# Patient Record
Sex: Female | Born: 1960 | Race: Black or African American | Hispanic: No | Marital: Married | Smoking: Former smoker
Health system: Southern US, Community
[De-identification: ages and names within clinical notes are randomized; demographics above are authoritative.]

## PROBLEM LIST (undated history)

## (undated) DIAGNOSIS — K219 Gastro-esophageal reflux disease without esophagitis: Secondary | ICD-10-CM

---

## 2005-09-06 ENCOUNTER — Encounter: Admission: RE | Admit: 2005-09-06 | Discharge: 2005-09-06 | Payer: Self-pay | Admitting: Obstetrics and Gynecology

## 2006-05-04 ENCOUNTER — Emergency Department (HOSPITAL_COMMUNITY): Admission: EM | Admit: 2006-05-04 | Discharge: 2006-05-04 | Payer: Self-pay | Admitting: Emergency Medicine

## 2006-08-02 ENCOUNTER — Emergency Department (HOSPITAL_COMMUNITY): Admission: EM | Admit: 2006-08-02 | Discharge: 2006-08-02 | Payer: Self-pay | Admitting: Emergency Medicine

## 2006-09-24 ENCOUNTER — Encounter: Admission: RE | Admit: 2006-09-24 | Discharge: 2006-09-24 | Payer: Self-pay | Admitting: Obstetrics and Gynecology

## 2007-05-01 ENCOUNTER — Emergency Department (HOSPITAL_COMMUNITY): Admission: EM | Admit: 2007-05-01 | Discharge: 2007-05-01 | Payer: Self-pay | Admitting: Emergency Medicine

## 2008-11-16 ENCOUNTER — Encounter: Admission: RE | Admit: 2008-11-16 | Discharge: 2008-11-16 | Payer: Self-pay | Admitting: Obstetrics and Gynecology

## 2009-12-19 ENCOUNTER — Encounter: Admission: RE | Admit: 2009-12-19 | Discharge: 2009-12-19 | Payer: Self-pay | Admitting: Obstetrics and Gynecology

## 2010-11-04 ENCOUNTER — Encounter: Payer: Self-pay | Admitting: Obstetrics and Gynecology

## 2010-11-05 ENCOUNTER — Encounter: Payer: Self-pay | Admitting: Obstetrics and Gynecology

## 2010-12-18 ENCOUNTER — Other Ambulatory Visit: Payer: Self-pay | Admitting: Obstetrics and Gynecology

## 2010-12-18 DIAGNOSIS — Z1231 Encounter for screening mammogram for malignant neoplasm of breast: Secondary | ICD-10-CM

## 2011-01-04 ENCOUNTER — Ambulatory Visit
Admission: RE | Admit: 2011-01-04 | Discharge: 2011-01-04 | Disposition: A | Discharge status: Home / Self Care | Payer: BC Managed Care – PPO | Source: Ambulatory Visit | Attending: Obstetrics and Gynecology | Admitting: Obstetrics and Gynecology

## 2011-01-04 DIAGNOSIS — Z1231 Encounter for screening mammogram for malignant neoplasm of breast: Secondary | ICD-10-CM

## 2012-03-30 ENCOUNTER — Other Ambulatory Visit: Payer: Self-pay | Admitting: Obstetrics and Gynecology

## 2012-03-30 DIAGNOSIS — Z1231 Encounter for screening mammogram for malignant neoplasm of breast: Secondary | ICD-10-CM

## 2012-04-06 ENCOUNTER — Ambulatory Visit
Admission: RE | Admit: 2012-04-06 | Discharge: 2012-04-06 | Disposition: A | Discharge status: Home / Self Care | Payer: BC Managed Care – PPO | Source: Ambulatory Visit | Attending: Obstetrics and Gynecology | Admitting: Obstetrics and Gynecology

## 2012-04-06 DIAGNOSIS — Z1231 Encounter for screening mammogram for malignant neoplasm of breast: Secondary | ICD-10-CM

## 2013-02-17 ENCOUNTER — Other Ambulatory Visit: Payer: Self-pay

## 2013-02-17 DIAGNOSIS — Z1231 Encounter for screening mammogram for malignant neoplasm of breast: Secondary | ICD-10-CM

## 2013-04-09 ENCOUNTER — Ambulatory Visit: Payer: BC Managed Care – PPO

## 2013-04-20 ENCOUNTER — Other Ambulatory Visit (HOSPITAL_COMMUNITY): Payer: Self-pay | Admitting: Cardiology

## 2013-04-20 DIAGNOSIS — M25562 Pain in left knee: Secondary | ICD-10-CM

## 2013-04-20 DIAGNOSIS — M7989 Other specified soft tissue disorders: Secondary | ICD-10-CM

## 2013-04-26 ENCOUNTER — Ambulatory Visit (HOSPITAL_COMMUNITY): Payer: BC Managed Care – PPO

## 2013-05-04 ENCOUNTER — Ambulatory Visit
Admission: RE | Admit: 2013-05-04 | Discharge: 2013-05-04 | Disposition: A | Discharge status: Home / Self Care | Payer: BC Managed Care – PPO | Source: Ambulatory Visit

## 2013-05-04 DIAGNOSIS — Z1231 Encounter for screening mammogram for malignant neoplasm of breast: Secondary | ICD-10-CM

## 2013-06-05 ENCOUNTER — Emergency Department (HOSPITAL_COMMUNITY)
Admission: EM | Admit: 2013-06-05 | Discharge: 2013-06-05 | Disposition: A | Discharge status: Home / Self Care | Payer: BC Managed Care – PPO | Source: Home / Self Care

## 2013-06-05 ENCOUNTER — Encounter (HOSPITAL_COMMUNITY): Payer: Self-pay | Admitting: Emergency Medicine

## 2013-06-05 DIAGNOSIS — L0202 Furuncle of face: Secondary | ICD-10-CM

## 2013-06-05 DIAGNOSIS — H6000 Abscess of external ear, unspecified ear: Secondary | ICD-10-CM

## 2013-06-05 MED ORDER — CEPHALEXIN 500 MG PO CAPS: 500.0000 mg | ORAL_CAPSULE | Freq: Two times a day (BID) | ORAL | Status: AC

## 2013-06-05 NOTE — ED Notes (Signed)
Right ear pain, visible redness at opening of ear canal, visible swelling

## 2013-06-05 NOTE — ED Provider Notes (Signed)
Medical screening examination/treatment/procedure(s) were performed by a resident physician or non-physician practitioner and as the supervising physician I was immediately available for consultation/collaboration.  Evan Corey, MD   Evan S Corey, MD 06/05/13 1956 

## 2013-06-05 NOTE — ED Provider Notes (Signed)
  CSN: [id number]     Arrival date & time 06/05/13  1212 History     None    No chief complaint on file.  (Consider location/radiation/quality/duration/timing/severity/associated sxs/prior Treatment) HPI Comments: Patient presents with right ear swelling. Noticed an area of swelling and tenderness on the inside of her right ear canal. No drainage. No lymph node swelling. No URI symptoms. No fever or chills. No loss of hearing.    The history is provided by the patient.    No past medical history on file. No past surgical history on file. No family history on file. History  Substance Use Topics  . Smoking status: Not on file  . Smokeless tobacco: Not on file  . Alcohol Use: Not on file   OB History   Grav Para Term Preterm Abortions TAB SAB Ect Mult Living                 Review of Systems  All other systems reviewed and are negative.    Allergies  Review of patient's allergies indicates not on file.  Home Medications   Current Outpatient Rx  Name  Route  Sig  Dispense  Refill  . cephALEXin (KEFLEX) 500 MG capsule   Oral   Take 1 capsule (500 mg total) by mouth 2 (two) times daily.   14 capsule   0    BP 121/86  Pulse 87  Temp(Src) 98.7 F (37.1 C) (Oral)  Resp 16  SpO2 99% Physical Exam  Nursing note and vitals reviewed. Constitutional: She appears well-developed and well-nourished. No distress.  HENT:  Head: Normocephalic and atraumatic.  Right Ear: Hearing and tympanic membrane normal. There is swelling. Tympanic membrane is not injected, not scarred and not perforated.  Mouth/Throat: No oropharyngeal exudate.  Small boil on the very inner ear of right canal. Non-fluctuant. No drainage. Pain to palpation.   Skin: Skin is warm and dry.  Psychiatric: Her behavior is normal.    ED Course   Procedures (including critical care time)  Labs Reviewed - No data to display No results found. 1. Boil, ear     MDM  Right ear boil-Warm compresses and abx.  Because of location refer to ENT if worsens  Azucena Fallen, PA-C 06/05/13 1251

## 2014-12-20 ENCOUNTER — Other Ambulatory Visit: Payer: Self-pay

## 2014-12-20 DIAGNOSIS — Z1231 Encounter for screening mammogram for malignant neoplasm of breast: Secondary | ICD-10-CM

## 2015-01-02 ENCOUNTER — Ambulatory Visit: Payer: Self-pay

## 2015-08-29 ENCOUNTER — Ambulatory Visit: Payer: Self-pay

## 2015-09-04 ENCOUNTER — Ambulatory Visit: Payer: Self-pay

## 2015-09-06 ENCOUNTER — Ambulatory Visit
Admission: RE | Admit: 2015-09-06 | Discharge: 2015-09-06 | Disposition: A | Discharge status: Home / Self Care | Payer: BLUE CROSS/BLUE SHIELD | Source: Ambulatory Visit

## 2015-09-06 DIAGNOSIS — Z1231 Encounter for screening mammogram for malignant neoplasm of breast: Secondary | ICD-10-CM

## 2016-10-23 ENCOUNTER — Other Ambulatory Visit: Payer: Self-pay | Admitting: Family Medicine

## 2016-10-23 DIAGNOSIS — Z1231 Encounter for screening mammogram for malignant neoplasm of breast: Secondary | ICD-10-CM

## 2016-11-28 ENCOUNTER — Ambulatory Visit: Payer: BLUE CROSS/BLUE SHIELD

## 2016-12-11 ENCOUNTER — Ambulatory Visit
Admission: RE | Admit: 2016-12-11 | Discharge: 2016-12-11 | Disposition: A | Discharge status: Home / Self Care | Payer: BLUE CROSS/BLUE SHIELD | Source: Ambulatory Visit | Attending: Family Medicine | Admitting: Family Medicine

## 2016-12-11 DIAGNOSIS — Z1231 Encounter for screening mammogram for malignant neoplasm of breast: Secondary | ICD-10-CM

## 2018-01-13 ENCOUNTER — Other Ambulatory Visit: Payer: Self-pay

## 2018-01-13 ENCOUNTER — Other Ambulatory Visit: Payer: Self-pay | Admitting: Obstetrics and Gynecology

## 2018-01-13 ENCOUNTER — Encounter (HOSPITAL_COMMUNITY): Payer: Self-pay | Admitting: *Deleted

## 2018-01-26 ENCOUNTER — Encounter (HOSPITAL_COMMUNITY)
Admission: RE | Disposition: A | Discharge status: Home / Self Care | Payer: Self-pay | Source: Ambulatory Visit | Attending: Obstetrics and Gynecology

## 2018-01-26 ENCOUNTER — Encounter (HOSPITAL_COMMUNITY): Payer: Self-pay

## 2018-01-26 ENCOUNTER — Ambulatory Visit (HOSPITAL_COMMUNITY)
Admission: RE | Admit: 2018-01-26 | Discharge: 2018-01-26 | Disposition: A | Discharge status: Home / Self Care | Payer: BLUE CROSS/BLUE SHIELD | Source: Ambulatory Visit | Attending: Obstetrics and Gynecology | Admitting: Obstetrics and Gynecology

## 2018-01-26 ENCOUNTER — Ambulatory Visit (HOSPITAL_COMMUNITY): Payer: BLUE CROSS/BLUE SHIELD | Admitting: Anesthesiology

## 2018-01-26 ENCOUNTER — Other Ambulatory Visit: Payer: Self-pay

## 2018-01-26 DIAGNOSIS — N858 Other specified noninflammatory disorders of uterus: Secondary | ICD-10-CM | POA: Insufficient documentation

## 2018-01-26 DIAGNOSIS — Z7951 Long term (current) use of inhaled steroids: Secondary | ICD-10-CM | POA: Diagnosis not present

## 2018-01-26 DIAGNOSIS — D250 Submucous leiomyoma of uterus: Secondary | ICD-10-CM | POA: Insufficient documentation

## 2018-01-26 DIAGNOSIS — Z87891 Personal history of nicotine dependence: Secondary | ICD-10-CM | POA: Diagnosis not present

## 2018-01-26 DIAGNOSIS — Z7982 Long term (current) use of aspirin: Secondary | ICD-10-CM | POA: Diagnosis not present

## 2018-01-26 HISTORY — PX: DILATATION & CURETTAGE/HYSTEROSCOPY WITH MYOSURE: SHX6511

## 2018-01-26 HISTORY — DX: Gastro-esophageal reflux disease without esophagitis: K21.9

## 2018-01-26 LAB — CBC
HEMATOCRIT: 37.3 % (ref 36.0–46.0)
Hemoglobin: 12.5 g/dL (ref 12.0–15.0)
MCH: 30.3 pg (ref 26.0–34.0)
MCHC: 33.5 g/dL (ref 30.0–36.0)
MCV: 90.3 fL (ref 78.0–100.0)
Platelets: 216 10*3/uL (ref 150–400)
RBC: 4.13 MIL/uL (ref 3.87–5.11)
RDW: 12.6 % (ref 11.5–15.5)
WBC: 4.7 10*3/uL (ref 4.0–10.5)

## 2018-01-26 SURGERY — DILATATION & CURETTAGE/HYSTEROSCOPY WITH MYOSURE
Anesthesia: General | Site: Vagina

## 2018-01-26 MED ORDER — MIDAZOLAM HCL 2 MG/2ML IJ SOLN
INTRAMUSCULAR | Status: DC | PRN
  Administered 2018-01-26: 2 mg via INTRAVENOUS

## 2018-01-26 MED ORDER — SODIUM CHLORIDE 0.9 % IR SOLN
Status: DC | PRN
  Administered 2018-01-26: 3000 mL

## 2018-01-26 MED ORDER — SCOPOLAMINE 1 MG/3DAYS TD PT72
MEDICATED_PATCH | TRANSDERMAL | Status: AC
  Administered 2018-01-26: 1.5 mg via TRANSDERMAL
  Filled 2018-01-26: qty 1

## 2018-01-26 MED ORDER — OXYCODONE-ACETAMINOPHEN 5-325 MG PO TABS: 1.0000 | ORAL_TABLET | ORAL | 0 refills | Status: AC | PRN

## 2018-01-26 MED ORDER — LACTATED RINGERS IV SOLN
INTRAVENOUS | Status: DC
  Administered 2018-01-26: 125 mL/h via INTRAVENOUS

## 2018-01-26 MED ORDER — ONDANSETRON HCL 4 MG/2ML IJ SOLN
INTRAMUSCULAR | Status: DC | PRN
  Administered 2018-01-26: 4 mg via INTRAVENOUS

## 2018-01-26 MED ORDER — DEXAMETHASONE SODIUM PHOSPHATE 10 MG/ML IJ SOLN
INTRAMUSCULAR | Status: DC | PRN
  Administered 2018-01-26: 4 mg via INTRAVENOUS

## 2018-01-26 MED ORDER — FENTANYL CITRATE (PF) 100 MCG/2ML IJ SOLN
INTRAMUSCULAR | Status: DC | PRN
  Administered 2018-01-26: 100 ug via INTRAVENOUS

## 2018-01-26 MED ORDER — PROPOFOL 10 MG/ML IV BOLUS
INTRAVENOUS | Status: DC | PRN
  Administered 2018-01-26: 200 mg via INTRAVENOUS

## 2018-01-26 MED ORDER — FENTANYL CITRATE (PF) 100 MCG/2ML IJ SOLN
INTRAMUSCULAR | Status: AC
  Filled 2018-01-26: qty 2

## 2018-01-26 MED ORDER — SCOPOLAMINE 1 MG/3DAYS TD PT72
1.0000 | MEDICATED_PATCH | Freq: Once | TRANSDERMAL | Status: DC
  Administered 2018-01-26: 1.5 mg via TRANSDERMAL

## 2018-01-26 MED ORDER — MIDAZOLAM HCL 2 MG/2ML IJ SOLN
INTRAMUSCULAR | Status: AC
  Filled 2018-01-26: qty 2

## 2018-01-26 SURGICAL SUPPLY — 17 items
CANISTER SUCT 3000ML PPV (MISCELLANEOUS) ×3 IMPLANT
CATH ROBINSON RED A/P 16FR (CATHETERS) ×3 IMPLANT
DEVICE MYOSURE LITE (MISCELLANEOUS) IMPLANT
DEVICE MYOSURE REACH (MISCELLANEOUS) IMPLANT
FILTER ARTHROSCOPY CONVERTOR (FILTER) ×3 IMPLANT
GLOVE BIOGEL PI IND STRL 7.0 (GLOVE) ×2 IMPLANT
GLOVE BIOGEL PI INDICATOR 7.0 (GLOVE) ×4
GLOVE ECLIPSE 6.5 STRL STRAW (GLOVE) ×3 IMPLANT
GOWN STRL REUS W/TWL LRG LVL3 (GOWN DISPOSABLE) ×6 IMPLANT
MYOSURE XL FIBROID REM (MISCELLANEOUS) ×3
PACK VAGINAL MINOR WOMEN LF (CUSTOM PROCEDURE TRAY) ×3 IMPLANT
PAD OB MATERNITY 4.3X12.25 (PERSONAL CARE ITEMS) ×3 IMPLANT
SEAL ROD LENS SCOPE MYOSURE (ABLATOR) ×3 IMPLANT
SYSTEM TISS REMOVAL MYSR XL RM (MISCELLANEOUS) IMPLANT
TOWEL OR 17X24 6PK STRL BLUE (TOWEL DISPOSABLE) ×6 IMPLANT
TUBING AQUILEX INFLOW (TUBING) ×3 IMPLANT
TUBING AQUILEX OUTFLOW (TUBING) ×3 IMPLANT

## 2018-01-26 NOTE — Discharge Instructions (Signed)

## 2018-01-26 NOTE — Anesthesia Postprocedure Evaluation (Signed)
Anesthesia Post Note  Patient: Carmen Velez  Procedure(s) Performed: DILATATION & CURETTAGE/HYSTEROSCOPY WITH MYOSURE Resection Submucosal Fibroid (N/A Vagina )     Patient location during evaluation: PACU Anesthesia Type: General Level of consciousness: sedated Pain management: pain level controlled Vital Signs Assessment: post-procedure vital signs reviewed and stable Respiratory status: spontaneous breathing and respiratory function stable Cardiovascular status: stable Postop Assessment: no apparent nausea or vomiting Anesthetic complications: no    Last Vitals:  Vitals:   01/26/18 1503 01/26/18 1530  BP:  130/72  Pulse: 62 62  Resp: 17 18  Temp: 36.9 C   SpO2: 100% 100%    Last Pain:  Vitals:   01/26/18 1530  TempSrc:   PainSc: 0-No pain   Pain Goal: Patients Stated Pain Goal: 3 (01/26/18 1234)               Jojo Geving DANIEL

## 2018-01-26 NOTE — Transfer of Care (Signed)
Immediate Anesthesia Transfer of Care Note  Patient: Carmen Velez  Procedure(s) Performed: DILATATION & CURETTAGE/HYSTEROSCOPY WITH MYOSURE Resection Submucosal Fibroid (N/A Vagina )  Patient Location: PACU  Anesthesia Type:General  Level of Consciousness: awake, alert  and oriented  Airway & Oxygen Therapy: Patient Spontanous Breathing and Patient connected to nasal cannula oxygen  Post-op Assessment: Report given to RN and Post -op Vital signs reviewed and stable  Post vital signs: Reviewed and stable  Last Vitals:  Vitals Value Taken Time  BP 123/88 01/26/2018  2:08 PM  Temp    Pulse 81 01/26/2018  2:10 PM  Resp 22 01/26/2018  2:10 PM  SpO2 87 % 01/26/2018  2:10 PM  Vitals shown include unvalidated device data.  Last Pain:  Vitals:   01/26/18 1234  TempSrc: Oral      Patients Stated Pain Goal: 3 (88/41/66 0630)  Complications: No apparent anesthesia complications

## 2018-01-26 NOTE — Brief Op Note (Signed)
01/26/2018  2:07 PM  PATIENT:  Carmen Velez  57 y.o. female  PRE-OPERATIVE DIAGNOSIS:  Submucosal Fibroid, Endometrial Thickening on Sonogram  POST-OPERATIVE DIAGNOSIS:  Submucosal Fibroid, Endometrial Thickening on Sonogram  PROCEDURE:  Diagnostic hysteroscopy, hysteroscopic resection of SM fibroids, D&C  SURGEON:  Surgeon(s) and Role:    * Sharnay Cashion, MD - Primary  PHYSICIAN ASSISTANT:   ASSISTANTS: none   ANESTHESIA:   general Finding: ant SM fibroid( pea size, large right lateral wall fibroid, left tubal ostia sclerosed, right tubal ostia seen EBL:  2 mL   BLOOD ADMINISTERED:none  DRAINS: none   LOCAL MEDICATIONS USED:  NONE  SPECIMEN:  Source of Specimen:  sm fibroid resections, endometrial curetting  DISPOSITION OF SPECIMEN:  PATHOLOGY  COUNTS:  YES  TOURNIQUET:  * No tourniquets in log *  DICTATION: .Other Dictation: Dictation Number [id number]  PLAN OF CARE: Discharge to home after PACU  PATIENT DISPOSITION:  PACU - hemodynamically stable.   Delay start of Pharmacological VTE agent (>24hrs) due to surgical blood loss or risk of bleeding: no

## 2018-01-26 NOTE — Anesthesia Preprocedure Evaluation (Addendum)
Anesthesia Evaluation  Patient identified by MRN, date of birth, ID band Patient awake    Reviewed: Allergy & Precautions, NPO status , Patient's Chart, lab work & pertinent test results  History of Anesthesia Complications Negative for: history of anesthetic complications  Airway Mallampati: II  TM Distance: >3 FB Neck ROM: Full    Dental no notable dental hx. (+) Dental Advisory Given   Pulmonary former smoker,    Pulmonary exam normal        Cardiovascular negative cardio ROS Normal cardiovascular exam     Neuro/Psych negative neurological ROS     GI/Hepatic Neg liver ROS, GERD  ,  Endo/Other  negative endocrine ROS  Renal/GU negative Renal ROS     Musculoskeletal negative musculoskeletal ROS (+)   Abdominal   Peds  Hematology negative hematology ROS (+)   Anesthesia Other Findings Day of surgery medications reviewed with the patient.  Reproductive/Obstetrics                            Anesthesia Physical Anesthesia Plan  ASA: II  Anesthesia Plan: General   Post-op Pain Management:    Induction: Intravenous  PONV Risk Score and Plan: 4 or greater and Ondansetron, Dexamethasone, Scopolamine patch - Pre-op and Diphenhydramine  Airway Management Planned: LMA  Additional Equipment:   Intra-op Plan:   Post-operative Plan: Extubation in OR  Informed Consent: I have reviewed the patients History and Physical, chart, labs and discussed the procedure including the risks, benefits and alternatives for the proposed anesthesia with the patient or authorized representative who has indicated his/her understanding and acceptance.   Dental advisory given  Plan Discussed with: Anesthesiologist  Anesthesia Plan Comments:        Anesthesia Quick Evaluation

## 2018-01-26 NOTE — H&P (Signed)
Carmen Velez is an 57 y.o. female BF hx TL presents for dx hysteroscopy, D&C, resection of endometrial mass  Pertinent Gynecological History: Menses: post-menopausal Bleeding: n/a Contraception: none DES exposure: denies Blood transfusions: none Sexually transmitted diseases: no past history Previous GYN Procedures: TL  Last mammogram: normal Date: 2018 Last pap: normal Date: 2018 OB History:   Menstrual History: Menarche age: n/a No LMP recorded (lmp unknown). Patient is postmenopausal.    Past Medical History:  Diagnosis Date  . GERD (gastroesophageal reflux disease)     History reviewed. No pertinent surgical history.  Family History  Problem Relation Age of Onset  . Breast cancer Neg Hx     Social History:  reports that she has quit smoking. She has never used smokeless tobacco. She reports that she drinks alcohol. She reports that she does not use drugs.  Allergies: No Known Allergies  Medications Prior to Admission  Medication Sig Dispense Refill Last Dose  . aspirin 81 MG tablet Take 81 mg by mouth 3 (three) times a week.    Past Month at Unknown time  . fluticasone (FLONASE) 50 MCG/ACT nasal spray Place 2 sprays into both nostrils daily as needed for allergies or rhinitis.   Past Month at Unknown time    Review of Systems  All other systems reviewed and are negative.   Blood pressure (!) 114/93, pulse 67, temperature 98.4 F (36.9 C), temperature source Oral, resp. rate 16, height 5\' 7"  (1.702 m), weight 96.6 kg (213 lb), SpO2 96 %. Physical Exam  Constitutional: She is oriented to person, place, and time. She appears well-developed and well-nourished.  HENT:  Head: Normocephalic and atraumatic.  Eyes: EOM are normal.  Neck: Neck supple.  Cardiovascular: Regular rhythm.  Respiratory: Breath sounds normal.  GI: Soft.  Genitourinary: Vagina normal.  Genitourinary Comments: Uterine fibroids  Musculoskeletal: Normal range of motion.   Neurological: She is alert and oriented to person, place, and time.  Skin: Skin is warm and dry.  Psychiatric: She has a normal mood and affect.    Results for orders placed or performed during the hospital encounter of 01/26/18 (from the past 24 hour(s))  CBC     Status: None   Collection Time: 01/26/18 12:25 PM  Result Value Ref Range   WBC 4.7 4.0 - 10.5 K/uL   RBC 4.13 3.87 - 5.11 MIL/uL   Hemoglobin 12.5 12.0 - 15.0 g/dL   HCT 37.3 36.0 - 46.0 %   MCV 90.3 78.0 - 100.0 fL   MCH 30.3 26.0 - 34.0 pg   MCHC 33.5 30.0 - 36.0 g/dL   RDW 12.6 11.5 - 15.5 %   Platelets 216 150 - 400 K/uL    No results found.  Assessment/Plan: Endometrial mass P) dx hysteroscopy, D&C resection of endom mass. Risk of surgery reviewed including infection, bleeding, uterine perforation and its risk, thermal injury, fluid overload and its mgmt. All ? answered  Eliyah Bazzi A Moriyah Byington 01/26/2018, 1:22 PM

## 2018-01-26 NOTE — Anesthesia Procedure Notes (Signed)
Procedure Name: LMA Insertion Date/Time: 01/26/2018 1:40 PM Performed by: Jonna Munro, CRNA Pre-anesthesia Checklist: Patient identified, Emergency Drugs available, Suction available, Patient being monitored and Timeout performed Patient Re-evaluated:Patient Re-evaluated prior to induction Oxygen Delivery Method: Circle system utilized Preoxygenation: Pre-oxygenation with 100% oxygen Induction Type: IV induction LMA: LMA inserted LMA Size: 4.0 Number of attempts: 1 Placement Confirmation: positive ETCO2 and breath sounds checked- equal and bilateral Tube secured with: Tape Dental Injury: Teeth and Oropharynx as per pre-operative assessment

## 2018-01-27 ENCOUNTER — Encounter (HOSPITAL_COMMUNITY): Payer: Self-pay | Admitting: Obstetrics and Gynecology

## 2018-01-27 NOTE — Op Note (Signed)
NAME:  Carmen Velez, Carmen Velez               ACCOUNT NO.:  MEDICAL RECORD NO.:  [id number]  LOCATION:                                 FACILITY:  PHYSICIAN:  Servando Salina, M.D.    DATE OF BIRTH:  DATE OF PROCEDURE:  01/26/2018 DATE OF DISCHARGE:                              OPERATIVE REPORT   PREOPERATIVE DIAGNOSES:  Endometrial mass, submucosal fibroid.  PROCEDURE:  Diagnostic hysteroscopy, hysteroscopic resection of submucosal fibroid, dilation and curettage.  POSTOPERATIVE DIAGNOSIS:  Submucosal fibroid.  ANESTHESIA:  General.  SURGEON:  Servando Salina, M.D.  ASSISTANT:  None.  DESCRIPTION OF PROCEDURE:  Under adequate general anesthesia, the patient was placed in the dorsal lithotomy position.  She was sterilely prepped and draped in usual fashion.  Bladder was catheterized for moderate amount of urine.  Examination under anesthesia revealed anteverted uterus.  No adnexal masses could be appreciated.  A bivalve speculum was placed in the vagina.  Single-tooth tenaculum was placed on the anterior lip of the cervix.  The cervix was then serially dilated up to #23 [facility] dilator.  The MyoSure hysteroscope was introduced into the uterine cavity.  Right lower uterine segment large submucosal fibroid was noted.  The left tubal ostium was sclerosed.  The right was normal. The endocervical canal was without lesion.  Using the XL MyoSure resectoscope, the fibroid was resected entirely and the endometrium was then resected.  When all was done, there was a small submucosal fibroid seen anteriorly, which was also resected.  When the procedure was felt to be complete, all instruments were then removed from the vagina.  SPECIMEN:  Labeled submucosal fibroid resection and endometrial curetting were sent to Pathology.  ESTIMATED BLOOD LOSS:  5 cc.  FLUID DEFICIT:  400 cc.  COMPLICATIONS:  None.  The patient tolerated the procedure well, was transferred to recovery room in  stable condition.     Servando Salina, M.D.     [place]/MED[facility]  D:  01/26/2018  T:  01/26/2018  Job:  [id number]

## 2018-05-05 IMAGING — MG 2D DIGITAL SCREENING BILATERAL MAMMOGRAM WITH CAD AND ADJUNCT TO
8 of 10 series · 8 of 22 positions shown · non-contrast
Comparison: Previous exam(s).

CLINICAL DATA: Screening.

EXAM:
2D DIGITAL SCREENING BILATERAL MAMMOGRAM WITH CAD AND ADJUNCT TOMO

[R MLO]
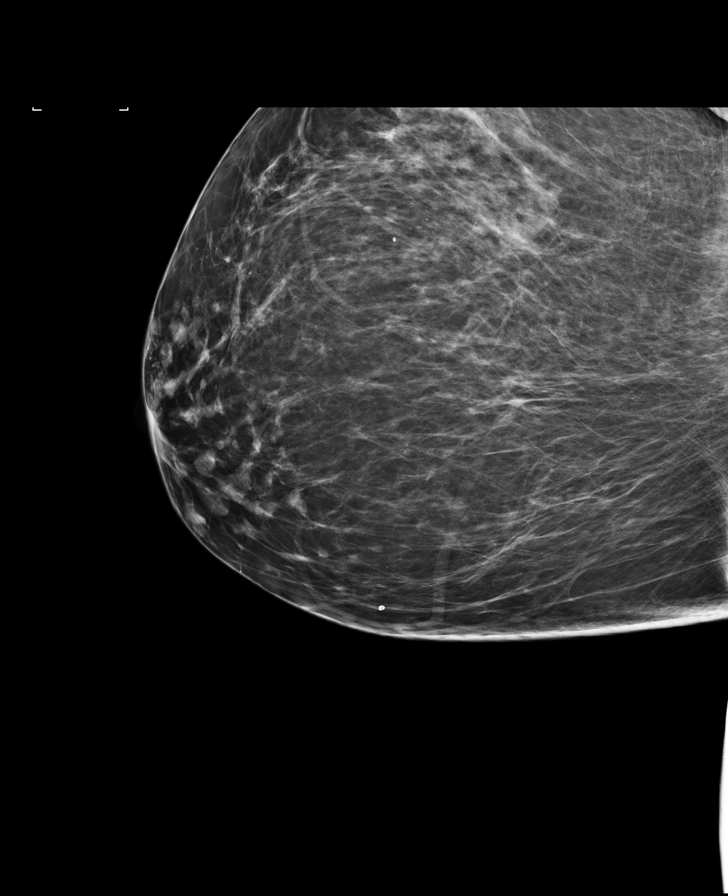

[L CC synth-2D]
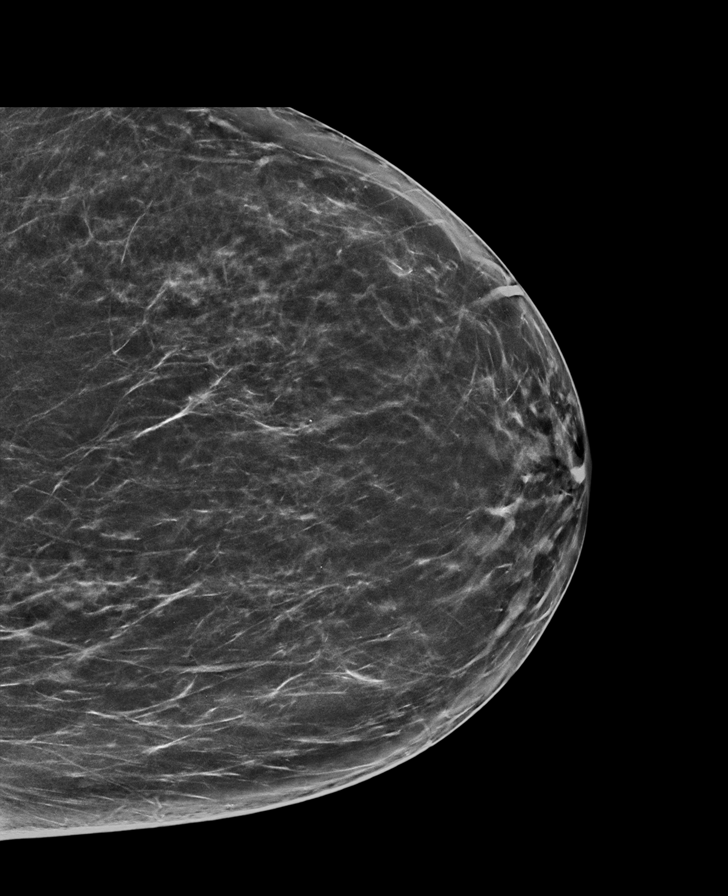

[R CC]
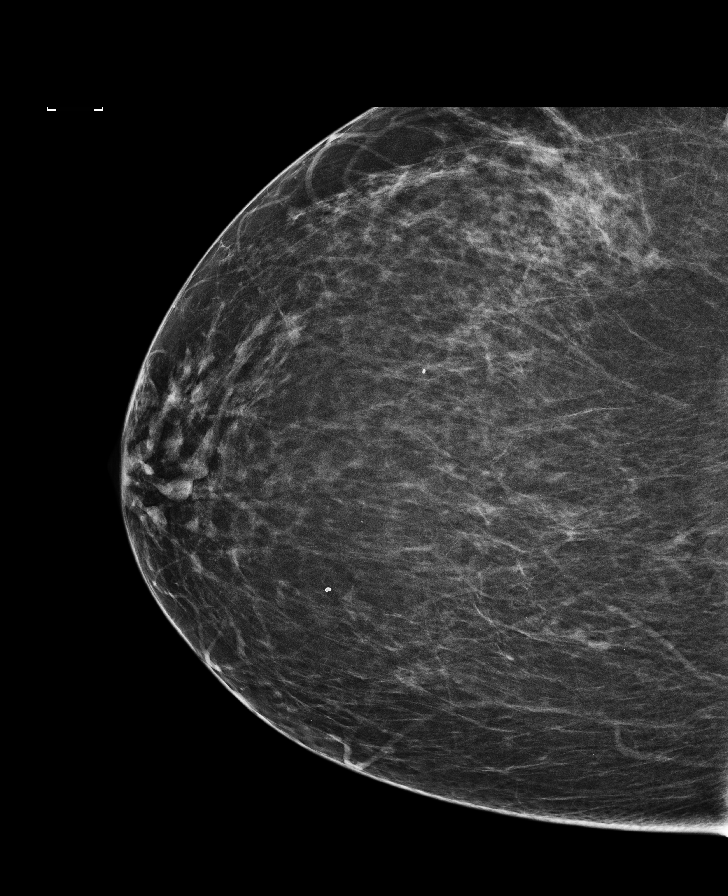

[R CC synth-2D]
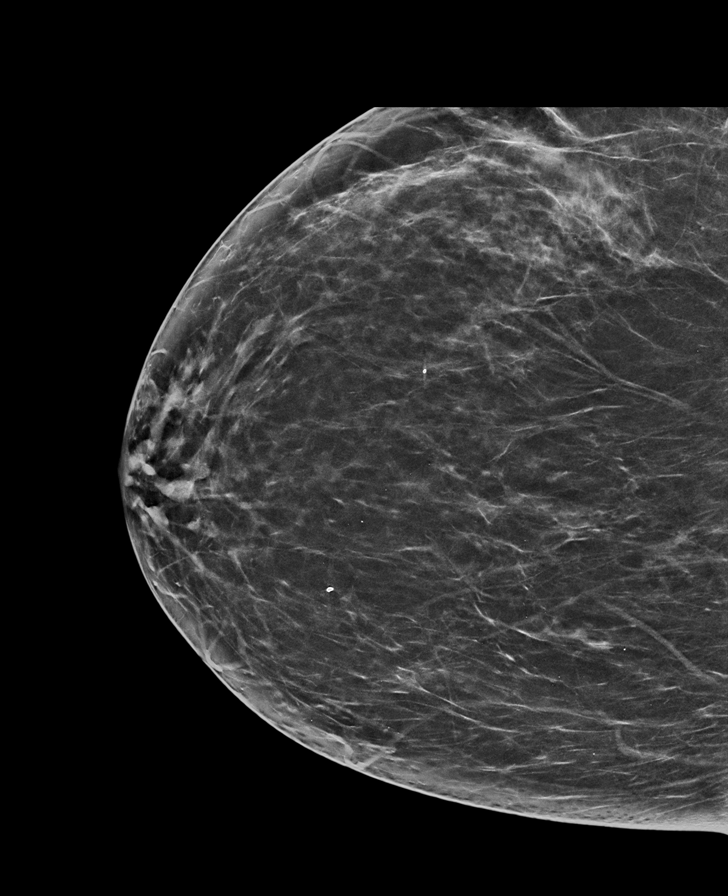

[L MLO synth-2D]
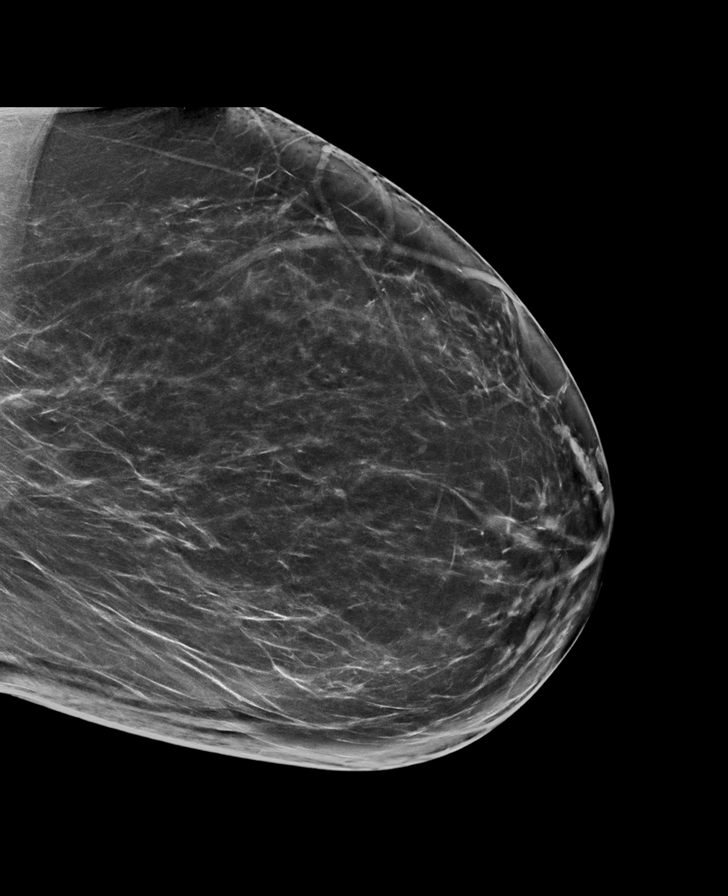

[L MLO]
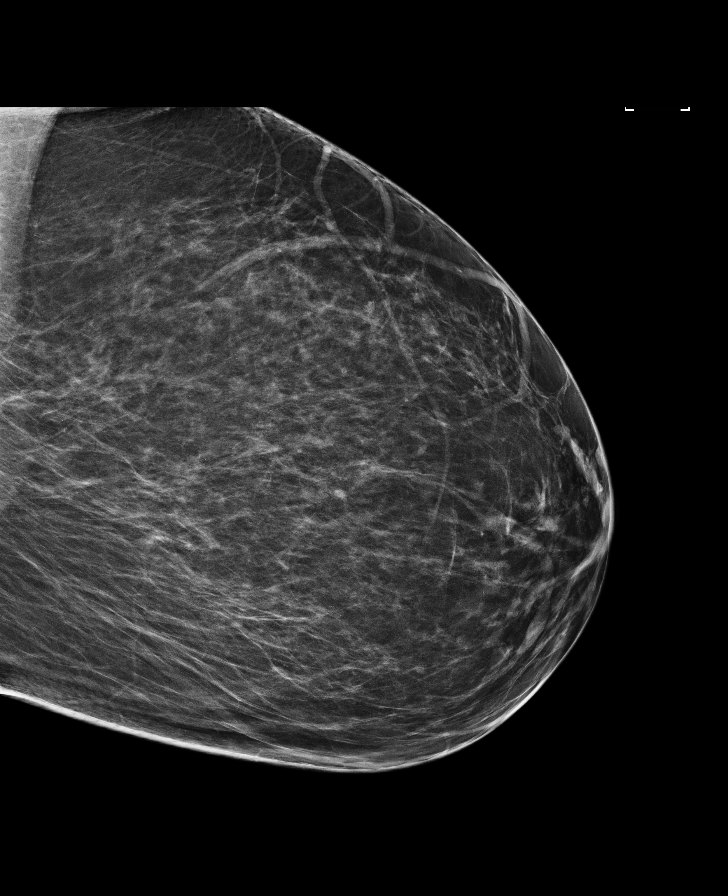

[L CC]
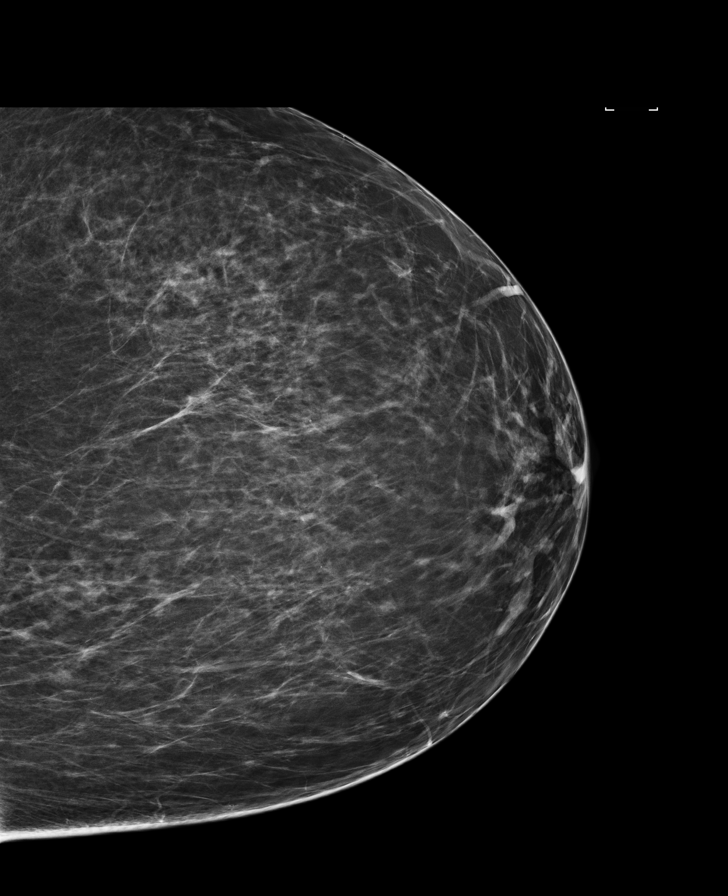

[L CC tomo · tomo slice 39/76.0]
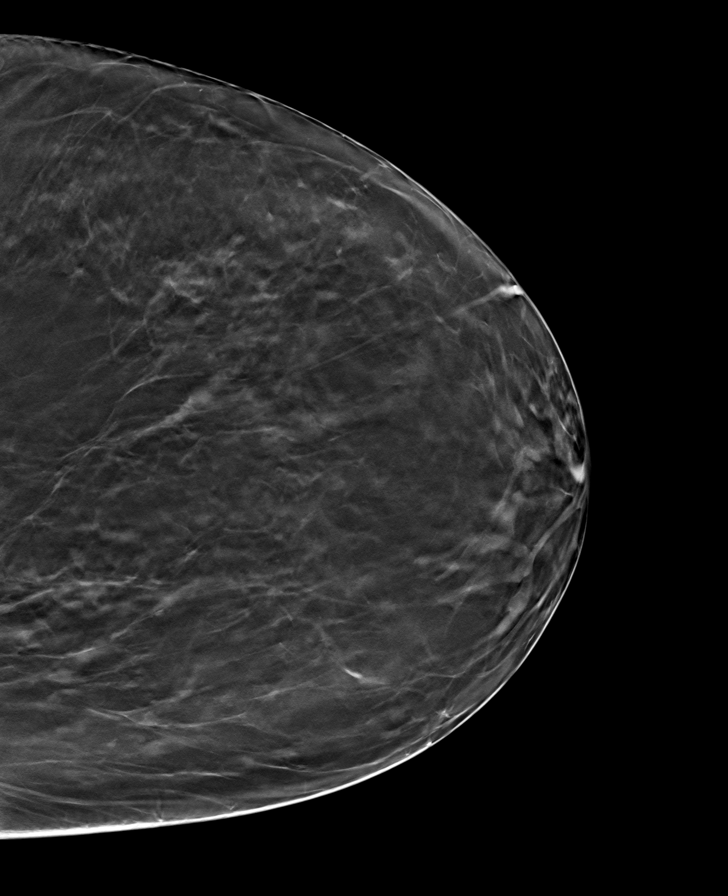

[8 of 22 positions shown; findings below may reference images not displayed]

ACR Breast Density Category b: There are scattered areas of
fibroglandular density.
FINDINGS: There are no findings suspicious for malignancy. Images were
processed with CAD.
IMPRESSION: No mammographic evidence of malignancy. A result letter of this
screening mammogram will be mailed directly to the patient.

RECOMMENDATION:
Screening mammogram in one year. (Code:97-6-RS4)

BI-RADS CATEGORY  1: Negative.

## 2018-11-27 ENCOUNTER — Other Ambulatory Visit: Payer: Self-pay | Admitting: Obstetrics and Gynecology

## 2018-11-27 DIAGNOSIS — Z1231 Encounter for screening mammogram for malignant neoplasm of breast: Secondary | ICD-10-CM

## 2018-12-28 ENCOUNTER — Ambulatory Visit: Payer: BLUE CROSS/BLUE SHIELD

## 2019-06-14 ENCOUNTER — Ambulatory Visit
Admission: RE | Admit: 2019-06-14 | Discharge: 2019-06-14 | Disposition: A | Discharge status: Home / Self Care | Payer: BC Managed Care – PPO | Source: Ambulatory Visit | Attending: Obstetrics and Gynecology | Admitting: Obstetrics and Gynecology

## 2019-06-14 ENCOUNTER — Other Ambulatory Visit: Payer: Self-pay

## 2019-06-14 DIAGNOSIS — Z1231 Encounter for screening mammogram for malignant neoplasm of breast: Secondary | ICD-10-CM

## 2019-06-15 ENCOUNTER — Other Ambulatory Visit: Payer: Self-pay

## 2019-06-15 DIAGNOSIS — Z20822 Contact with and (suspected) exposure to covid-19: Secondary | ICD-10-CM

## 2019-06-17 LAB — NOVEL CORONAVIRUS, NAA: SARS-CoV-2, NAA: NOT DETECTED

## 2023-01-31 DIAGNOSIS — Z1231 Encounter for screening mammogram for malignant neoplasm of breast: Secondary | ICD-10-CM | POA: Diagnosis not present

## 2023-02-25 DIAGNOSIS — F4325 Adjustment disorder with mixed disturbance of emotions and conduct: Secondary | ICD-10-CM | POA: Diagnosis not present

## 2023-02-26 DIAGNOSIS — H5213 Myopia, bilateral: Secondary | ICD-10-CM | POA: Diagnosis not present

## 2023-11-27 ENCOUNTER — Ambulatory Visit: Payer: BC Managed Care – PPO | Admitting: Family Medicine

## 2024-02-06 DIAGNOSIS — Z1231 Encounter for screening mammogram for malignant neoplasm of breast: Secondary | ICD-10-CM | POA: Diagnosis not present

## 2024-02-06 LAB — HM MAMMOGRAPHY

## 2024-03-15 ENCOUNTER — Encounter: Payer: Self-pay | Admitting: Family Medicine

## 2024-03-25 ENCOUNTER — Ambulatory Visit (INDEPENDENT_AMBULATORY_CARE_PROVIDER_SITE_OTHER): Admitting: Family Medicine

## 2024-03-25 ENCOUNTER — Encounter: Payer: Self-pay | Admitting: Family Medicine

## 2024-03-25 VITALS — BP 107/73 | HR 76 | Ht 67.0 in | Wt 187.0 lb

## 2024-03-25 DIAGNOSIS — Z114 Encounter for screening for human immunodeficiency virus [HIV]: Secondary | ICD-10-CM

## 2024-03-25 DIAGNOSIS — Z7689 Persons encountering health services in other specified circumstances: Secondary | ICD-10-CM

## 2024-03-25 DIAGNOSIS — Z13228 Encounter for screening for other metabolic disorders: Secondary | ICD-10-CM

## 2024-03-25 DIAGNOSIS — Z0000 Encounter for general adult medical examination without abnormal findings: Secondary | ICD-10-CM | POA: Diagnosis not present

## 2024-03-25 DIAGNOSIS — Z1159 Encounter for screening for other viral diseases: Secondary | ICD-10-CM

## 2024-03-25 DIAGNOSIS — Z13220 Encounter for screening for lipoid disorders: Secondary | ICD-10-CM

## 2024-03-25 DIAGNOSIS — Z130 Encounter for screening for diseases of the blood and blood-forming organs and certain disorders involving the immune mechanism: Secondary | ICD-10-CM

## 2024-03-25 DIAGNOSIS — Z1329 Encounter for screening for other suspected endocrine disorder: Secondary | ICD-10-CM

## 2024-03-25 NOTE — Progress Notes (Signed)
 New Patient Office Visit  Subjective    Patient ID: Carmen Velez, female    DOB: January 22, 1961  Age: 63 y.o. MRN: [id number]  CC:  Chief Complaint  Patient presents with   New Patient (Initial Visit)    Patient wanting labs done     HPI Carmen Velez presents to establish care and for routine annual exam. Patient denies known chronic med issues or taking meds regularly. Patient denies acute complaints.    Outpatient Encounter Medications as of 03/25/2024  Medication Sig   aspirin 81 MG tablet Take 81 mg by mouth 3 (three) times a week.  (Patient not taking: Reported on 03/25/2024)   fluticasone (FLONASE) 50 MCG/ACT nasal spray Place 2 sprays into both nostrils daily as needed for allergies or rhinitis. (Patient not taking: Reported on 03/25/2024)   No facility-administered encounter medications on file as of 03/25/2024.    Past Medical History:  Diagnosis Date   GERD (gastroesophageal reflux disease)     Past Surgical History:  Procedure Laterality Date   DILATATION & CURETTAGE/HYSTEROSCOPY WITH MYOSURE N/A 01/26/2018   Procedure: DILATATION & CURETTAGE/HYSTEROSCOPY WITH MYOSURE Resection Submucosal Fibroid;  Surgeon: Carmen Marking, MD;  Location: [facility];  Service: Gynecology;  Laterality: N/A;    Family History  Problem Relation Age of Onset   Breast cancer Neg Hx     Social History   Socioeconomic History   Marital status: Married    Spouse name: Not on file   Number of children: Not on file   Years of education: Not on file   Highest education level: Not on file  Occupational History   Not on file  Tobacco Use   Smoking status: Former   Smokeless tobacco: Never  Vaping Use   Vaping status: Never Used  Substance and Sexual Activity   Alcohol use: Yes    Comment: SOCIALLY   Drug use: No   Sexual activity: Yes    Birth control/protection: None  Other Topics Concern   Not on file  Social History Narrative   Not on file   Social Drivers  of Health   Financial Resource Strain: Not on file  Food Insecurity: Not on file  Transportation Needs: Not on file  Physical Activity: Not on file  Stress: Not on file  Social Connections: Not on file  Intimate Partner Violence: Not on file    Review of Systems  All other systems reviewed and are negative.       Objective   BP 107/73 (BP Location: Right Arm, Patient Position: Sitting)   Pulse 76   Ht 5' 7 (1.702 m)   Wt 187 lb (84.8 kg)   LMP  (LMP Unknown)   SpO2 95%   BMI 29.29 kg/m   Physical Exam Vitals and nursing note reviewed.  Constitutional:      General: She is not in acute distress. HENT:     Head: Normocephalic and atraumatic.     Right Ear: Tympanic membrane, ear canal and external ear normal.     Left Ear: Tympanic membrane, ear canal and external ear normal.     Nose: Nose normal.     Mouth/Throat:     Mouth: Mucous membranes are moist.     Pharynx: Oropharynx is clear.   Eyes:     Conjunctiva/sclera: Conjunctivae normal.     Pupils: Pupils are equal, round, and reactive to light.   Neck:     Thyroid: No thyromegaly.   Cardiovascular:  Rate and Rhythm: Normal rate and regular rhythm.     Heart sounds: Normal heart sounds. No murmur heard. Pulmonary:     Effort: Pulmonary effort is normal. No respiratory distress.     Breath sounds: Normal breath sounds.  Abdominal:     General: There is no distension.     Palpations: Abdomen is soft. There is no mass.     Tenderness: There is no abdominal tenderness.   Musculoskeletal:        General: Normal range of motion.     Cervical back: Normal range of motion and neck supple.   Skin:    General: Skin is warm and dry.   Neurological:     General: No focal deficit present.     Mental Status: She is alert and oriented to person, place, and time.   Psychiatric:        Mood and Affect: Mood normal.        Behavior: Behavior normal.         Assessment & Plan:   Annual physical  exam -     Comprehensive metabolic panel with GFR  Encounter to establish care  Screening for deficiency anemia -     CBC with Differential/Platelet  Screening for lipid disorders -     Lipid panel  Screening for endocrine/metabolic/immunity disorders -     VITAMIN D 25 Hydroxy (Vit-D Deficiency, Fractures) -     TSH  Screening for HIV (human immunodeficiency virus) -     HIV Antibody (routine testing w rflx)  Need for hepatitis C screening test -     Hepatitis C antibody     Return in about 1 year (around 03/25/2025) for physical.   Carmen Lama, MD

## 2024-03-26 LAB — COMPREHENSIVE METABOLIC PANEL WITH GFR
ALT: 12 IU/L (ref 0–32)
AST: 14 IU/L (ref 0–40)
Albumin: 4.6 g/dL (ref 3.9–4.9)
Alkaline Phosphatase: 72 IU/L (ref 44–121)
BUN/Creatinine Ratio: 8 — ABNORMAL LOW (ref 12–28)
BUN: 7 mg/dL — ABNORMAL LOW (ref 8–27)
Bilirubin Total: 0.3 mg/dL (ref 0.0–1.2)
CO2: 21 mmol/L (ref 20–29)
Calcium: 9.7 mg/dL (ref 8.7–10.3)
Chloride: 105 mmol/L (ref 96–106)
Creatinine, Ser: 0.91 mg/dL (ref 0.57–1.00)
Globulin, Total: 2.7 g/dL (ref 1.5–4.5)
Glucose: 75 mg/dL (ref 70–99)
Potassium: 4.6 mmol/L (ref 3.5–5.2)
Sodium: 145 mmol/L — ABNORMAL HIGH (ref 134–144)
Total Protein: 7.3 g/dL (ref 6.0–8.5)
eGFR: 71 mL/min/{1.73_m2} (ref >59)

## 2024-03-26 LAB — CBC WITH DIFFERENTIAL/PLATELET
Basophils Absolute: 0.1 10*3/uL (ref 0.0–0.2)
Basos: 1 %
EOS (ABSOLUTE): 0.3 10*3/uL (ref 0.0–0.4)
Eos: 6 %
Hematocrit: 41.6 % (ref 34.0–46.6)
Hemoglobin: 12.8 g/dL (ref 11.1–15.9)
Immature Grans (Abs): 0 10*3/uL (ref 0.0–0.1)
Immature Granulocytes: 0 %
Lymphocytes Absolute: 2.2 10*3/uL (ref 0.7–3.1)
Lymphs: 51 %
MCH: 30.5 pg (ref 26.6–33.0)
MCHC: 30.8 g/dL — ABNORMAL LOW (ref 31.5–35.7)
MCV: 99 fL — ABNORMAL HIGH (ref 79–97)
Monocytes Absolute: 0.4 10*3/uL (ref 0.1–0.9)
Monocytes: 8 %
Neutrophils Absolute: 1.5 10*3/uL (ref 1.4–7.0)
Neutrophils: 34 %
Platelets: 241 10*3/uL (ref 150–450)
RBC: 4.19 x10E6/uL (ref 3.77–5.28)
RDW: 11.8 % (ref 11.7–15.4)
WBC: 4.5 10*3/uL (ref 3.4–10.8)

## 2024-03-26 LAB — HIV ANTIBODY (ROUTINE TESTING W REFLEX): HIV Screen 4th Generation wRfx: NONREACTIVE

## 2024-03-26 LAB — VITAMIN D 25 HYDROXY (VIT D DEFICIENCY, FRACTURES): Vit D, 25-Hydroxy: 36.7 ng/mL (ref 30.0–100.0)

## 2024-03-26 LAB — TSH: TSH: 1.15 u[IU]/mL (ref 0.450–4.500)

## 2024-03-26 LAB — LIPID PANEL
Chol/HDL Ratio: 3.9 ratio (ref 0.0–4.4)
Cholesterol, Total: 232 mg/dL — ABNORMAL HIGH (ref 100–199)
HDL: 59 mg/dL (ref >39)
LDL Chol Calc (NIH): 156 mg/dL — ABNORMAL HIGH (ref 0–99)
Triglycerides: 96 mg/dL (ref 0–149)
VLDL Cholesterol Cal: 17 mg/dL (ref 5–40)

## 2024-03-26 LAB — HEPATITIS C ANTIBODY: Hep C Virus Ab: NONREACTIVE

## 2024-03-29 ENCOUNTER — Ambulatory Visit: Payer: Self-pay | Admitting: Family Medicine

## 2024-06-23 ENCOUNTER — Ambulatory Visit: Payer: BC Managed Care – PPO | Admitting: Family Medicine

## 2024-06-23 DIAGNOSIS — Z7689 Persons encountering health services in other specified circumstances: Secondary | ICD-10-CM

## 2024-10-26 ENCOUNTER — Emergency Department (HOSPITAL_COMMUNITY)

## 2024-10-26 ENCOUNTER — Emergency Department (HOSPITAL_COMMUNITY)
Admission: EM | Admit: 2024-10-26 | Discharge: 2024-10-27 | Attending: Emergency Medicine | Admitting: Emergency Medicine

## 2024-10-26 DIAGNOSIS — Z5321 Procedure and treatment not carried out due to patient leaving prior to being seen by health care provider: Secondary | ICD-10-CM | POA: Insufficient documentation

## 2024-10-26 DIAGNOSIS — R10A1 Flank pain, right side: Secondary | ICD-10-CM | POA: Insufficient documentation

## 2024-10-26 DIAGNOSIS — R6883 Chills (without fever): Secondary | ICD-10-CM | POA: Diagnosis not present

## 2024-10-26 LAB — CBC WITH DIFFERENTIAL/PLATELET
Abs Immature Granulocytes: 0.01 K/uL (ref 0.00–0.07)
Basophils Absolute: 0 K/uL (ref 0.0–0.1)
Basophils Relative: 1 %
Eosinophils Absolute: 0 K/uL (ref 0.0–0.5)
Eosinophils Relative: 0 %
HCT: 40.8 % (ref 36.0–46.0)
Hemoglobin: 13.6 g/dL (ref 12.0–15.0)
Immature Granulocytes: 0 %
Lymphocytes Relative: 18 %
Lymphs Abs: 0.7 K/uL (ref 0.7–4.0)
MCH: 30.6 pg (ref 26.0–34.0)
MCHC: 33.3 g/dL (ref 30.0–36.0)
MCV: 91.7 fL (ref 80.0–100.0)
Monocytes Absolute: 0.5 K/uL (ref 0.1–1.0)
Monocytes Relative: 12 %
Neutro Abs: 2.5 K/uL (ref 1.7–7.7)
Neutrophils Relative %: 69 %
Platelets: 176 K/uL (ref 150–400)
RBC: 4.45 MIL/uL (ref 3.87–5.11)
RDW: 11.4 % — ABNORMAL LOW (ref 11.5–15.5)
WBC: 3.7 K/uL — ABNORMAL LOW (ref 4.0–10.5)
nRBC: 0 % (ref 0.0–0.2)

## 2024-10-26 LAB — COMPREHENSIVE METABOLIC PANEL WITH GFR
ALT: 17 U/L (ref 0–44)
AST: 24 U/L (ref 15–41)
Albumin: 4.4 g/dL (ref 3.5–5.0)
Alkaline Phosphatase: 66 U/L (ref 38–126)
Anion gap: 13 (ref 5–15)
BUN: 5 mg/dL — ABNORMAL LOW (ref 8–23)
CO2: 23 mmol/L (ref 22–32)
Calcium: 9.1 mg/dL (ref 8.9–10.3)
Chloride: 99 mmol/L (ref 98–111)
Creatinine, Ser: 0.69 mg/dL (ref 0.44–1.00)
GFR, Estimated: >60 mL/min
Glucose, Bld: 100 mg/dL — ABNORMAL HIGH (ref 70–99)
Potassium: 3.4 mmol/L — ABNORMAL LOW (ref 3.5–5.1)
Sodium: 134 mmol/L — ABNORMAL LOW (ref 135–145)
Total Bilirubin: 0.5 mg/dL (ref 0.0–1.2)
Total Protein: 7.8 g/dL (ref 6.5–8.1)

## 2024-10-26 LAB — LIPASE, BLOOD: Lipase: 27 U/L (ref 11–51)

## 2024-10-26 MED ORDER — ONDANSETRON 8 MG PO TBDP
8.0000 mg | ORAL_TABLET | Freq: Once | ORAL | Status: AC
  Administered 2024-10-26: 8 mg via ORAL
  Filled 2024-10-26: qty 1

## 2024-10-26 MED ORDER — IOHEXOL 300 MG/ML  SOLN
100.0000 mL | Freq: Once | INTRAMUSCULAR | Status: AC | PRN
  Administered 2024-10-26: 100 mL via INTRAVENOUS

## 2024-10-26 MED ORDER — HYDROCODONE-ACETAMINOPHEN 5-325 MG PO TABS
1.0000 | ORAL_TABLET | Freq: Once | ORAL | Status: AC
  Administered 2024-10-26: 1 via ORAL
  Filled 2024-10-26: qty 1

## 2024-10-26 NOTE — ED Provider Triage Note (Cosign Needed Addendum)
 Emergency Medicine Provider Triage Evaluation Note  Carmen Velez , a 64 y.o. female  was evaluated in triage.  Pt complains of right flank pain.  Started this past Sunday.  Also with chills nausea and vomiting but denies urinary symptoms.  Denies chest pain shortness of breath.  Also reports that members of her family who have had a viral illness in the last week.   Review of Systems  Positive: See above Negative: See above  Physical Exam  BP 131/84   Pulse 81   Temp (!) 97.5 F (36.4 C) (Oral)   Resp 16   LMP  (LMP Unknown)   SpO2 100%  Gen:   Awake, no distress   Resp:  Normal effort  MSK:   Moves extremities without difficulty  Other:    Medical Decision Making  Medically screening exam initiated at 4:45 PM.  Appropriate orders placed.  Oyinkansola A Hayes-Greene was informed that the remainder of the evaluation will be completed by another provider, this initial triage assessment does not replace that evaluation, and the importance of remaining in the ED until their evaluation is complete.  Work up started     Lang Norleen POUR, PA-C 10/26/24 1649

## 2024-10-26 NOTE — ED Triage Notes (Addendum)
 Patient BIBA coming from home c/o right flank pain, chills Sunday,  n/v, denies urinary symptoms. BP 144 78 HR 82 RR 16 98% RA CBG 114 T 98.8. Patient became diaphoretic sitting in whee;chair and laid herself on the floor,

## 2024-10-28 ENCOUNTER — Ambulatory Visit: Admitting: Family Medicine

## 2024-12-13 ENCOUNTER — Ambulatory Visit: Admitting: Family Medicine
